# Patient Record
Sex: Female | Born: 1990 | Race: White | Hispanic: No | Marital: Single | Smoking: Former smoker
Health system: Southern US, Community
[De-identification: ages and names within clinical notes are randomized; demographics above are authoritative.]

## PROBLEM LIST (undated history)

## (undated) DIAGNOSIS — J45909 Unspecified asthma, uncomplicated: Secondary | ICD-10-CM

## (undated) DIAGNOSIS — G629 Polyneuropathy, unspecified: Secondary | ICD-10-CM

## (undated) DIAGNOSIS — F419 Anxiety disorder, unspecified: Secondary | ICD-10-CM

## (undated) DIAGNOSIS — F5081 Binge eating disorder: Secondary | ICD-10-CM

## (undated) DIAGNOSIS — E119 Type 2 diabetes mellitus without complications: Secondary | ICD-10-CM

## (undated) DIAGNOSIS — I10 Essential (primary) hypertension: Secondary | ICD-10-CM

## (undated) DIAGNOSIS — F32A Depression, unspecified: Secondary | ICD-10-CM

## (undated) HISTORY — DX: Type 2 diabetes mellitus without complications: E11.9

## (undated) HISTORY — DX: Unspecified asthma, uncomplicated: J45.909

## (undated) HISTORY — PX: CHOLECYSTECTOMY: SHX55

## (undated) HISTORY — PX: APPENDECTOMY: SHX54

## (undated) HISTORY — DX: Binge eating disorder: F50.81

## (undated) HISTORY — DX: Anxiety disorder, unspecified: F41.9

## (undated) HISTORY — DX: Essential (primary) hypertension: I10

## (undated) HISTORY — DX: Depression, unspecified: F32.A

## (undated) HISTORY — PX: NASAL SINUS SURGERY: SHX719

## (undated) HISTORY — DX: Polyneuropathy, unspecified: G62.9

## (undated) HISTORY — PX: TUBAL LIGATION: SHX77

---

## 2013-09-27 DIAGNOSIS — M5440 Lumbago with sciatica, unspecified side: Secondary | ICD-10-CM | POA: Insufficient documentation

## 2013-09-27 DIAGNOSIS — M533 Sacrococcygeal disorders, not elsewhere classified: Secondary | ICD-10-CM | POA: Insufficient documentation

## 2013-09-27 DIAGNOSIS — M519 Unspecified thoracic, thoracolumbar and lumbosacral intervertebral disc disorder: Secondary | ICD-10-CM

## 2013-09-27 HISTORY — DX: Sacrococcygeal disorders, not elsewhere classified: M53.3

## 2013-09-27 HISTORY — DX: Lumbago with sciatica, unspecified side: M54.40

## 2013-09-27 HISTORY — DX: Unspecified thoracic, thoracolumbar and lumbosacral intervertebral disc disorder: M51.9

## 2017-08-31 HISTORY — DX: Morbid (severe) obesity due to excess calories: E66.01

## 2019-11-13 DIAGNOSIS — G8929 Other chronic pain: Secondary | ICD-10-CM

## 2019-11-13 DIAGNOSIS — M545 Low back pain, unspecified: Secondary | ICD-10-CM | POA: Insufficient documentation

## 2019-11-13 HISTORY — DX: Other chronic pain: G89.29

## 2020-02-04 ENCOUNTER — Other Ambulatory Visit: Payer: Self-pay | Admitting: Physician Assistant

## 2020-02-04 DIAGNOSIS — G8929 Other chronic pain: Secondary | ICD-10-CM | POA: Insufficient documentation

## 2020-02-04 DIAGNOSIS — M5442 Lumbago with sciatica, left side: Secondary | ICD-10-CM

## 2020-02-04 HISTORY — DX: Other chronic pain: G89.29

## 2020-02-13 ENCOUNTER — Ambulatory Visit
Admission: RE | Admit: 2020-02-13 | Discharge: 2020-02-13 | Disposition: A | Discharge status: Home / Self Care | Payer: Medicaid Other | Source: Ambulatory Visit | Attending: Physician Assistant | Admitting: Physician Assistant

## 2020-02-13 ENCOUNTER — Other Ambulatory Visit: Payer: Self-pay

## 2020-02-13 DIAGNOSIS — M5442 Lumbago with sciatica, left side: Secondary | ICD-10-CM

## 2020-02-13 DIAGNOSIS — G8929 Other chronic pain: Secondary | ICD-10-CM

## 2020-02-13 MED ORDER — IOPAMIDOL (ISOVUE-M 200) INJECTION 41%
1.0000 mL | Freq: Once | INTRAMUSCULAR | Status: AC
  Administered 2020-02-13: 1 mL via INTRA_ARTICULAR

## 2020-02-13 MED ORDER — METHYLPREDNISOLONE ACETATE 40 MG/ML INJ SUSP (RADIOLOG
120.0000 mg | Freq: Once | INTRAMUSCULAR | Status: AC
  Administered 2020-02-13: 120 mg via INTRA_ARTICULAR

## 2020-02-13 NOTE — Discharge Instructions (Signed)

## 2020-09-21 ENCOUNTER — Other Ambulatory Visit: Payer: Self-pay | Admitting: Neurosurgery

## 2020-09-21 DIAGNOSIS — G8929 Other chronic pain: Secondary | ICD-10-CM

## 2022-02-18 ENCOUNTER — Encounter: Payer: Self-pay | Admitting: *Deleted

## 2022-02-18 ENCOUNTER — Encounter: Payer: Self-pay | Admitting: Cardiology

## 2022-02-18 DIAGNOSIS — J189 Pneumonia, unspecified organism: Secondary | ICD-10-CM

## 2022-02-18 HISTORY — DX: Pneumonia, unspecified organism: J18.9

## 2022-02-21 ENCOUNTER — Encounter: Payer: Self-pay | Admitting: *Deleted

## 2022-02-21 DIAGNOSIS — J45909 Unspecified asthma, uncomplicated: Secondary | ICD-10-CM | POA: Insufficient documentation

## 2022-02-21 DIAGNOSIS — F50819 Binge eating disorder, unspecified: Secondary | ICD-10-CM | POA: Insufficient documentation

## 2022-02-21 DIAGNOSIS — F5081 Binge eating disorder: Secondary | ICD-10-CM | POA: Insufficient documentation

## 2022-02-21 DIAGNOSIS — E119 Type 2 diabetes mellitus without complications: Secondary | ICD-10-CM | POA: Insufficient documentation

## 2022-02-21 DIAGNOSIS — F419 Anxiety disorder, unspecified: Secondary | ICD-10-CM | POA: Insufficient documentation

## 2022-02-21 DIAGNOSIS — F32A Depression, unspecified: Secondary | ICD-10-CM | POA: Insufficient documentation

## 2022-02-21 DIAGNOSIS — I10 Essential (primary) hypertension: Secondary | ICD-10-CM | POA: Insufficient documentation

## 2022-02-21 DIAGNOSIS — G629 Polyneuropathy, unspecified: Secondary | ICD-10-CM | POA: Insufficient documentation

## 2022-02-22 NOTE — Progress Notes (Unsigned)
Cardiology Office Note:    Date:  02/23/2022   ID:  Vanessa Chandler, DOB [birth year], MRN [id number]  PCP:  Laverle Hobby, NP  Cardiologist:  Shirlee More, MD   Referring MD: Laverle Hobby, NP  ASSESSMENT:    1. Precordial pain   2. Primary hypertension   3. Type 2 diabetes mellitus without complication, unspecified whether long term insulin use ([place])   4. Xanthelasma   5. Palpitations    PLAN:    In order of problems listed above:  Despite her young age she has multiple cardiovascular risk including type 2 diabetes hypertension cigarette smoking and a family history of early onset CAD.  On physical exam she has xanthelasma and risk with family all hyperlipidemia and today we will do a lipid profile including LP(a).  We discussed modalities for ischemia evaluation and I think that despite her young age she is served best by a cardiac CTA and will give [place] an absolute answer yes no if she has coronary disease coronary calcium score to judge whether she would benefit from lipid-lowering therapy. Diabetes hypertension are stable currently not on cardiovascular medications Check lipid profile at risk for familial hyperlipidemia He has also had palpitation not severe or sustained and is currently wearing an event monitor through her primary care physician.  Next appointment 3 months   Medication Adjustments/Labs and Tests Ordered: Current medicines are reviewed at length with the patient today.  Concerns regarding medicines are outlined above.  No orders of the defined types were placed in this encounter.  No orders of the defined types were placed in this encounter.    Chief Complaint  Patient presents with   Chest Pain    History of Present Illness:    Vanessa Chandler is a 32 y.o. female with a history of type 2 diabetes and hypertension who is being seen today for the evaluation of chest pain at the request of Laverle Hobby, NP.  No other complaint was palpitation appears  and that that event monitor was applied by her primary care physician when she was seen 02/10/2022.  She was seen [facility] for chest pain 02/03/2021 she was felt not to have acute coronary syndrome the EKG was described as sinus rhythm with T wave inversions to 3 and aVF similar to the previous in 2019.  High-sensitivity troponin assay CMP with potassium 3.8 otherwise normal chest x-ray normal  He has a history of type 2 diabetes now controlled without medications after weight loss of greater than 80 pounds supervised medical with phentermine Has no diabetic complications kidney neuropathy or eye No history of heart disease congenital rheumatic heart murmur or atrial fibrillation  Her clinical problem is chest pain there are 2 forms 1 and she notices very locations in the chest pins-and-needles momentarily Secondary she is having symptoms quite suggestive of angina with precordial pressure starts substernally radiates through both chest and up into the neck the episodes occur without activity but are relieved with about 30 minutes of rest.  She has had 2 ED visits for this with a troponin is negative discharged home told she has an abnormal EKG and a follow-up with cardiology  She has exercise intolerance and exertional shortness of breath more than moderate activity this has been a chronic chronic Risk factors also include father with onset of heart disease in his early 34s and cigarette smoking.   Past Medical History:  Diagnosis Date   Anxiety    Asthma  Binge eating disorder    Depression    Hypertension    Midline low back pain with sciatica 09/27/2013   Morbid obesity with BMI of 40.0-44.9, adult ([place]) 08/31/2017   Formatting of this note might be different from the original. TSH  & HgbA1c drawn at [facility].   Neuropathy    Sacroiliac joint dysfunction of both sides 09/27/2013   Type 2 diabetes mellitus ([place])     Past Surgical History:  Procedure  Laterality Date   APPENDECTOMY     CESAREAN SECTION     CHOLECYSTECTOMY     NASAL SINUS SURGERY     TUBAL LIGATION      Current Medications: Current Meds  Medication Sig   linaclotide (LINZESS) 290 MCG CAPS capsule Take 290 mcg by mouth daily before breakfast.   phentermine 37.5 MG capsule Take 37.5 mg by mouth every morning.     Allergies:   Patient has no known allergies.   Social History   Socioeconomic History   Marital status: Single    Spouse name: Not on file   Number of children: Not on file   Years of education: Not on file   Highest education level: Not on file  Occupational History   Not on file  Tobacco Use   Smoking status: Former    Packs/day: 0.50    Types: Cigarettes   Smokeless tobacco: Never  Substance and Sexual Activity   Alcohol use: Not on file   Drug use: Not on file   Sexual activity: Not on file  Other Topics Concern   Not on file  Social History Narrative   Not on file   Social Determinants of Health   Financial Resource Strain: Not on file  Food Insecurity: Not on file  Transportation Needs: Not on file  Physical Activity: Not on file  Stress: Not on file  Social Connections: Not on file     Family History: The patient's family history includes Breast cancer in her maternal aunt; Heart disease in her father; Hypertension in her father; Lung cancer in her maternal grandfather; Pancreatic cancer in her paternal grandfather.  ROS:   ROS Please see the history of present illness.     All other systems reviewed and are negative.  EKGs/Labs/Other Studies Reviewed:    The following studies were reviewed today:   EKG:  EKG is  ordered today.  The ekg ordered today is personally reviewed and demonstrates sinus rhythm minor ST-T abnormality nonspecific   Physical Exam:    VS:  BP 110/74 (BP Location: Right Arm, Patient Position: Sitting)   Pulse 75   Ht 4' 10"$  (1.473 m)   Wt 179 lb 3.2 oz (81.3 kg)   SpO2 99%   BMI 37.45 kg/m      Wt Readings from Last 3 Encounters:  02/23/22 179 lb 3.2 oz (81.3 kg)  02/10/22 174 lb (78.9 kg)     GEN: She has xanthelasma about the left eye well nourished, well developed in no acute distress HEENT: Normal NECK: No JVD; No carotid bruits LYMPHATICS: No lymphadenopathy CARDIAC: RRR, no murmurs, rubs, gallops RESPIRATORY:  Clear to auscultation without rales, wheezing or rhonchi  ABDOMEN: Soft, non-tender, non-distended MUSCULOSKELETAL:  No edema; No deformity  SKIN: Warm and dry NEUROLOGIC:  Alert and oriented x 3 PSYCHIATRIC:  Normal affect   Seen with Jacobo Forest, RN chaperone    Signed, Shirlee More, MD  02/23/2022 11:24 AM    [place]

## 2022-02-23 ENCOUNTER — Encounter: Payer: Self-pay | Admitting: Cardiology

## 2022-02-23 ENCOUNTER — Ambulatory Visit: Payer: Managed Care, Other (non HMO) | Attending: Cardiology | Admitting: Cardiology

## 2022-02-23 VITALS — BP 110/74 | HR 75 | Ht <= 58 in | Wt 179.2 lb

## 2022-02-23 DIAGNOSIS — R002 Palpitations: Secondary | ICD-10-CM

## 2022-02-23 DIAGNOSIS — H0260 Xanthelasma of unspecified eye, unspecified eyelid: Secondary | ICD-10-CM

## 2022-02-23 DIAGNOSIS — R072 Precordial pain: Secondary | ICD-10-CM

## 2022-02-23 DIAGNOSIS — I10 Essential (primary) hypertension: Secondary | ICD-10-CM | POA: Diagnosis not present

## 2022-02-23 DIAGNOSIS — E119 Type 2 diabetes mellitus without complications: Secondary | ICD-10-CM | POA: Diagnosis not present

## 2022-02-23 MED ORDER — METOPROLOL TARTRATE 100 MG PO TABS: 100.0000 mg | ORAL_TABLET | Freq: Once | ORAL | 0 refills | Status: DC

## 2022-02-23 NOTE — Patient Instructions (Addendum)
Medication Instructions:  Your physician recommends that you continue on your current medications as directed. Please refer to the Current Medication list given to you today.  *If you need a refill on your cardiac medications before your next appointment, please call your pharmacy*   Lab Work: Your physician recommends that you return for lab work in:   Labs 1 week before CT: BMP, Lipid,LPa  If you have labs (blood work) drawn today and your tests are completely normal, you will receive your results only by: [place] (if you have MyChart) OR A paper copy in the mail If you have any lab test that is abnormal or we need to change your treatment, we will call you to review the results.   Testing/Procedures:   Your cardiac CT will be scheduled at one of the below locations:   [facility] [address] [place], [place] [address] (336) [place] [address] [place], [place] [address] [phone]  [facility] [place],  [address] [phone]  If scheduled at [facility], please arrive at the [facility] and Children's Entrance (Entrance C2) of [facility] 30 minutes prior to test start time. You can use the FREE valet parking offered at entrance C (encouraged to control the heart rate for the test)  Proceed to the [facility] Radiology Department (first floor) to check-in and test prep.  All radiology patients and guests should use entrance C2 at [facility], accessed from [facility], even though the hospital's physical address listed is [address].    If scheduled at [facility] or [facility], please arrive 15 mins early for check-in and test prep.   Please follow these instructions carefully (unless otherwise directed):  On the Night Before  the Test: Be sure to Drink plenty of water. Do not consume any caffeinated/decaffeinated beverages or chocolate 12 hours prior to your test. Do not take any antihistamines 12 hours prior to your test.  On the Day of the Test: Drink plenty of water until 1 hour prior to the test. Do not eat any food 1 hour prior to test. You may take your regular medications prior to the test.  Take metoprolol (Lopressor) two hours prior to test. FEMALES- please wear underwire-free bra if available, avoid dresses & tight clothing      After the Test: Drink plenty of water. After receiving IV contrast, you may experience a mild flushed feeling. This is normal. On occasion, you may experience a mild rash up to 24 hours after the test. This is not dangerous. If this occurs, you can take Benadryl 25 mg and increase your fluid intake. If you experience trouble breathing, this can be serious. If it is severe call 911 IMMEDIATELY. If it is mild, please call our office.  We will call to schedule your test 2-4 weeks out understanding that some insurance companies will need an authorization prior to the service being performed.   For non-scheduling related questions, please contact the cardiac imaging nurse navigator should you have any questions/concerns: Marchia Bond, Cardiac Imaging Nurse Navigator Gordy Clement, Cardiac Imaging Nurse Navigator [facility] Direct Office Dial: [phone]   For scheduling needs, including cancellations and rescheduling, please call [place], [phone].    Follow-Up: At [facility], you and your health needs are our priority.  As  part of our continuing mission to provide you with exceptional heart care, we have created designated Provider Care Teams.  These Care Teams include your primary Cardiologist (physician) and Advanced Practice Providers (APPs -  Physician Assistants and Nurse Practitioners) who all work together to provide you  with the care you need, when you need it.  We recommend signing up for the patient portal called "MyChart".  Sign up information is provided on this After Visit Summary.  MyChart is used to connect with patients for Virtual Visits (Telemedicine).  Patients are able to view lab/test results, encounter notes, upcoming appointments, etc.  Non-urgent messages can be sent to your provider as well.   To learn more about what you can do with MyChart, go to [email or web address].    Your next appointment:   8 week(s)  Provider:   Shirlee More, MD    Other Instructions None  Accompanied physician for entire patient appointment.  Suann Larry. Kenton Kingfisher, RN

## 2022-02-28 ENCOUNTER — Telehealth: Payer: Self-pay | Admitting: Cardiology

## 2022-02-28 ENCOUNTER — Other Ambulatory Visit: Payer: Self-pay

## 2022-02-28 ENCOUNTER — Telehealth: Payer: Self-pay

## 2022-02-28 MED ORDER — METOPROLOL TARTRATE 100 MG PO TABS: 100.0000 mg | ORAL_TABLET | Freq: Once | ORAL | 0 refills | Status: DC

## 2022-02-28 NOTE — Telephone Encounter (Signed)
Spoke to the patient while she was having her blood drawn for her CT scan and informed her that I re-sent the metoprolol prescription to her pharmacy. Patient was appreciative for [place] sending the prescription again and had no further questions at this time.

## 2022-02-28 NOTE — Telephone Encounter (Signed)
Patient came in office for labs before CT- stated the pharmacy never received the rx for metoprolol needed for her CT scan.  Patient would like rx sent to [facility] in [place] on [address] CT is scheduled for this Wednesday- 03/02/22

## 2022-03-01 ENCOUNTER — Telehealth: Payer: Self-pay | Admitting: Cardiology

## 2022-03-01 ENCOUNTER — Other Ambulatory Visit: Payer: Self-pay

## 2022-03-01 ENCOUNTER — Telehealth (HOSPITAL_COMMUNITY): Payer: Self-pay | Admitting: *Deleted

## 2022-03-01 ENCOUNTER — Other Ambulatory Visit: Payer: Self-pay | Admitting: Cardiology

## 2022-03-01 LAB — BASIC METABOLIC PANEL
BUN/Creatinine Ratio: 17 (ref 9–23)
BUN: 12 mg/dL (ref 6–20)
CO2: 26 mmol/L (ref 20–29)
Calcium: 9.4 mg/dL (ref 8.7–10.2)
Chloride: 103 mmol/L (ref 96–106)
Creatinine, Ser: 0.69 mg/dL (ref 0.57–1.00)
Glucose: 87 mg/dL (ref 70–99)
Potassium: 4.3 mmol/L (ref 3.5–5.2)
Sodium: 135 mmol/L (ref 134–144)
eGFR: 117 mL/min/{1.73_m2} (ref >59)

## 2022-03-01 LAB — LIPID PANEL
Chol/HDL Ratio: 2.9 ratio (ref 0.0–4.4)
Cholesterol, Total: 152 mg/dL (ref 100–199)
HDL: 53 mg/dL (ref >39)
LDL Chol Calc (NIH): 84 mg/dL (ref 0–99)
Triglycerides: 81 mg/dL (ref 0–149)
VLDL Cholesterol Cal: 15 mg/dL (ref 5–40)

## 2022-03-01 LAB — LIPOPROTEIN A (LPA): Lipoprotein (a): 220.9 nmol/L — ABNORMAL HIGH (ref <75.0)

## 2022-03-01 NOTE — Telephone Encounter (Signed)
Attempted to call patient regarding upcoming cardiac CT appointment. °Left message on voicemail with name and callback number ° °Marygrace Sandoval RN Navigator Cardiac Imaging °[facility] °[phone] Office °[phone] Cell ° °

## 2022-03-01 NOTE — Telephone Encounter (Signed)
Called patient and informed her that she needed to take the medication that was prescribed for her cardiac CTA. It will help to relax her heart so they can get accurate results. Patient also concerned about the lipoprotien level being so high and I explained that was the reason she needed to have the cardiac CTA. Patient was agreeable with this plan to have the cardiac CTA and had no further questions at this time.

## 2022-03-01 NOTE — Telephone Encounter (Signed)
Patient has concerns with lab results. Lipoprotein is out of range. She worries that she should not take medication needed prior to her CT tomorrow. Please advise as soon as possible.

## 2022-03-02 ENCOUNTER — Ambulatory Visit (HOSPITAL_COMMUNITY)
Admission: RE | Admit: 2022-03-02 | Discharge: 2022-03-02 | Disposition: A | Discharge status: Home / Self Care | Payer: Managed Care, Other (non HMO) | Source: Ambulatory Visit | Attending: Cardiology | Admitting: Cardiology

## 2022-03-02 DIAGNOSIS — R072 Precordial pain: Secondary | ICD-10-CM

## 2022-03-02 MED ORDER — NITROGLYCERIN 0.4 MG SL SUBL
SUBLINGUAL_TABLET | SUBLINGUAL | Status: AC
  Administered 2022-03-02: 0.8 mg via SUBLINGUAL
  Filled 2022-03-02: qty 2

## 2022-03-02 MED ORDER — IOHEXOL 350 MG/ML SOLN
100.0000 mL | Freq: Once | INTRAVENOUS | Status: AC | PRN
  Administered 2022-03-02: 100 mL via INTRAVENOUS

## 2022-03-02 MED ORDER — NITROGLYCERIN 0.4 MG SL SUBL: 0.8000 mg | SUBLINGUAL_TABLET | Freq: Once | SUBLINGUAL | Status: AC

## 2022-03-03 ENCOUNTER — Other Ambulatory Visit: Payer: Self-pay

## 2022-03-03 DIAGNOSIS — R072 Precordial pain: Secondary | ICD-10-CM

## 2022-03-03 MED ORDER — ROSUVASTATIN CALCIUM 5 MG PO TABS: 5.0000 mg | ORAL_TABLET | Freq: Every day | ORAL | 3 refills | Status: DC

## 2022-03-24 ENCOUNTER — Telehealth: Payer: Self-pay

## 2022-03-24 NOTE — Telephone Encounter (Signed)
Zio monitor results received from patients referring provider. Scanned to chart media forward to Dr Bettina Gavia for review

## 2022-04-11 NOTE — Progress Notes (Unsigned)
Cardiology Office Note:    Date:  04/12/2022   ID:  Vanessa Chandler, DOB [birth year], MRN [id number]  PCP:  Laverle Hobby, NP  Cardiologist:  Shirlee More, MD    Referring MD: Laverle Hobby, NP    ASSESSMENT:    1. Mild CAD   2. Elevated lipoprotein(a)   3. Primary hypertension   4. Type 2 diabetes mellitus without complication, unspecified whether long term insulin use    PLAN:    In order of problems listed above:  Cardiac CTA with minimal soft noncalcified plaque less than 25% 1 area LAD but not normal elevated LP(a) would benefit from lipid-lowering therapy as a treatment small molecule is available would be appropriate to initiate and bring her back in 1 year for follow-up Stable hypertension diabetes Have a lipid profile her next follow-up with her PCP Screen her father brother and children for LP(a)   Next appointment: 1 year   Medication Adjustments/Labs and Tests Ordered: Current medicines are reviewed at length with the patient today.  Concerns regarding medicines are outlined above.  No orders of the defined types were placed in this encounter.  No orders of the defined types were placed in this encounter.   No chief complaint on file.   History of Present Illness:    Vanessa Chandler is a 33 y.o. female with a hx of hypertension type 2 diabetes former cigarette smoker family history of premature CAD 02/23/2022 last seen.  Was recently seen in [place] ED 03/23/2022 with a syndrome of 1 day of chest back pain cough productive with sputum and subjective fever diffuse myalgias.  He was called and viral syndrome.  EKGs were unremarkable troponin was normal.  LP(a) was severely elevated at 221 nmol/l  She underwent cardiac CTA reported 03/02/2022 with a coronary calcium score of 0 and minimal nonobstructive CAD with noncalcified plaque 1 to 24% left anterior descending coronary artery and second marginal branch.  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 [employer] has good healthcare knowledge I gave her information from [place] [facility] educated about LP(a) and told her I thought that her children brother and father should be screened Despite a coronary calcium score of 0 she has minimal soft plaque in the coronaries has a genetic lipid disorder I think she would benefit at this point in time with statin therapy and I will ask her at her next visit with her PCP to have a lipid profile performed An event monitor showed no significant arrhythmia Past Medical History:  Diagnosis Date   Anxiety    Asthma    Binge eating disorder    Depression    Hypertension    Midline low back pain with sciatica 09/27/2013   Morbid obesity with BMI of 40.0-44.9, adult 08/31/2017   Formatting of this note might be different from the original. TSH  & HgbA1c drawn at [facility].   Neuropathy    Sacroiliac joint dysfunction of both sides 09/27/2013   Type 2 diabetes mellitus     Past Surgical History:  Procedure Laterality Date   APPENDECTOMY     CESAREAN SECTION     CHOLECYSTECTOMY     NASAL SINUS SURGERY     TUBAL LIGATION      Current Medications: Current Meds  Medication Sig   linaclotide (LINZESS) 290 MCG CAPS capsule Take 290 mcg by mouth daily before breakfast.   phentermine 37.5 MG capsule Take 37.5 mg by mouth every morning.  rosuvastatin (CRESTOR) 5 MG tablet Take 1 tablet (5 mg total) by mouth daily.     Allergies:   Patient has no known allergies.   Social History   Socioeconomic History   Marital status: Single    Spouse name: Not on file   Number of children: Not on file   Years of education: Not on file   Highest education level: Not on file  Occupational History   Not on file  Tobacco Use   Smoking status: Former    Packs/day: .5    Types: Cigarettes   Smokeless tobacco: Never  Substance and Sexual Activity   Alcohol use: Not on file   Drug use: Not on  file   Sexual activity: Not on file  Other Topics Concern   Not on file  Social History Narrative   Not on file   Social Determinants of Health   Financial Resource Strain: Not on file  Food Insecurity: Not on file  Transportation Needs: Not on file  Physical Activity: Not on file  Stress: Not on file  Social Connections: Not on file     Family History: The patient's family history includes Breast cancer in her maternal aunt; Heart disease in her father; Hypertension in her father; Lung cancer in her maternal grandfather; Pancreatic cancer in her paternal grandfather. ROS:   Please see the history of present illness.    All other systems reviewed and are negative.  EKGs/Labs/Other Studies Reviewed:    The following studies were reviewed today:  Cardiac Studies & Procedures          CT SCANS  CT CORONARY MORPH W/CTA COR W/SCORE 03/04/2022  Addendum 03/04/2022 11:54 PM ADDENDUM REPORT: 03/04/2022 23:52  EXAM: OVER-READ INTERPRETATION  CT CHEST  The following report is an over-read performed by radiologist Dr. [facility] on 03/04/2022. This over-read does not include interpretation of cardiac or coronary anatomy or pathology. The coronary CTA interpretation by the cardiologist is attached.  COMPARISON:  01/16/2017  FINDINGS: Visualized lungs are clear. Central airways are patent. Bronchial wall thickening is present in keeping with airway inflammation. No pathologic thoracic adenopathy. Esophagus unremarkable. Visualized upper abdomen is unremarkable. No acute bone abnormality. No lytic or blastic bone lesion.  IMPRESSION: 1. Bronchial wall thickening in keeping with airway inflammation.   Electronically Signed By: Fidela Salisbury M.D. On: 03/04/2022 23:52  Narrative CLINICAL DATA:  32 Year-old White Female  EXAM: Cardiac/Coronary  CTA  TECHNIQUE: The patient was scanned on a [employer].  FINDINGS: Scan was  triggered in the descending thoracic aorta. Axial non-contrast 3 mm slices were carried out through the heart. The data set was analyzed on a dedicated work station and scored using the [place]. Gantry rotation speed was 250 msecs and collimation was .6 mm. 0.8 mg of sl NTG was given. The 3D data set was reconstructed in 5% intervals of the 67-82 % of the R-R cycle. Diastolic phases were analyzed on a dedicated work station using MPR, MIP and VRT modes. The patient received 100 cc of contrast.  Coronary Arteries:  Normal coronary origin.  Right dominance.  Coronary Calcium Score:  Left main: 0  Left anterior descending artery: 0  Left circumflex artery: 0  Right coronary artery: 0  Total: 0  Percentile: 1st for age, sex, and race matched control.  RCA is a large dominant artery that gives rise to PDA and PLA. There is no significant plaque.  Left main is a  large artery that gives rise to LAD and LCX arteries. There is no significant plaque.  LAD is a large vessel that gives rise to one large D1. Minimal soft plaque (1-24%) mid LAD.  LCX is a non-dominant artery that gives rise to multiple OM vessels. Minimal soft plaque (1-24%) mid OM2.  Other findings:  Aorta: Normal size.  No calcifications.  No dissection.  Main Pulmonary Artery: Normal size of the pulmonary artery.  Systemic Veins: Normal drainage  Aortic Valve:  Tri-leaflet.  No calcifications.  Mitral valve: No calcifications.  Normal pulmonary vein drainage into the left atrium.  Normal left atrial appendage without a thrombus.  Interatrial septum with no communications.  Left Ventricle: Normal size  Left Atrium: Normal size  Right Ventricle: Normal size  Right Atrium: Normal size  Pericardium: Normal thickness  Extra-cardiac findings: See attached radiology report for non-cardiac structures.  IMPRESSION: 1. Coronary calcium score of 0. This was 1st percentile for age, sex, and race  matched control.  2. Normal coronary origin with right dominance.  3. CAD-RADS 1. Minimal non-obstructive CAD (1-24%). Consider non-atherosclerotic causes of chest pain. Consider preventive therapy and risk factor modification. Images saved to PACS.  RECOMMENDATIONS: RECOMMENDATIONS The proposed cut-off value of 1,651 AU yielded a 93 % sensitivity and 75 % specificity in grading AS severity in patients with classical low-flow, low-gradient AS. Proposed different cut-off values to define severe AS for men and women as 2,065 AU and 1,274 AU, respectively. The joint European and American recommendations for the assessment of AS consider the aortic valve calcium score as a continuum - a very high calcium score suggests severe AS and a low calcium score suggests severe AS is unlikely.  Kerman Passey, et al. 2017 ESC/EACTS Guidelines for the management of valvular heart disease. Eur Heart J 2017;38:2739-91.  Coronary artery calcium (CAC) score is a strong predictor of incident coronary heart disease (CHD) and provides predictive information beyond traditional risk factors. CAC scoring is reasonable to use in the decision to withhold, postpone, or initiate statin therapy in intermediate-risk or selected borderline-risk asymptomatic adults (age 44-75 years and LDL-C >=70 to <190 mg/dL) who do not have diabetes or established atherosclerotic cardiovascular disease (ASCVD).* In intermediate-risk (10-year ASCVD risk >=7.5% to <20%) adults or selected borderline-risk (10-year ASCVD risk >=5% to <7.5%) adults in whom a CAC score is measured for the purpose of making a treatment decision the following recommendations have been made:  If CAC = 0, it is reasonable to withhold statin therapy and reassess in 5 to 10 years, as long as higher risk conditions are absent (diabetes mellitus, family history of premature CHD in first degree relatives (males <55 years; females <65  years), cigarette smoking, LDL >=190 mg/dL or other independent risk factors).  If CAC is 1 to 99, it is reasonable to initiate statin therapy for patients >=8 years of age.  If CAC is >=100 or >=75th percentile, it is reasonable to initiate statin therapy at any age.  Cardiology referral should be considered for patients with CAC scores =400 or >=75th percentile.  *2018 AHA/ACC/AACVPR/AAPA/ABC/ACPM/ADA/AGS/APhA/ASPC/NLA/PCNA Guideline on the Management of Blood Cholesterol: A Report of the American College of Cardiology/American Heart Association Task Force on Clinical Practice Guidelines. J Am Coll Cardiol. 2019;73(24):3168-3209.  Rudean Haskell, MD  Electronically Signed: By: Rudean Haskell M.D. On: 03/02/2022 15:45            Recent Labs: 02/28/2022: BUN 12; Creatinine, Ser 0.69; Potassium 4.3; Sodium 135  Recent Lipid Panel    Component Value Date/Time   CHOL 152 02/28/2022 0805   TRIG 81 02/28/2022 0805   HDL 53 02/28/2022 0805   CHOLHDL 2.9 02/28/2022 0805   LDLCALC 84 02/28/2022 0805    Physical Exam:    VS:  BP 120/78 (BP Location: Right Arm, Patient Position: Sitting)   Pulse 70   Ht 4\' 10"  (1.473 m)   Wt 181 lb (82.1 kg)   SpO2 99%   BMI 37.83 kg/m     Wt Readings from Last 3 Encounters:  04/12/22 181 lb (82.1 kg)  02/23/22 179 lb 3.2 oz (81.3 kg)  02/10/22 174 lb (78.9 kg)     GEN:  Well nourished, well developed in no acute distress HEENT: Normal NECK: No JVD; No carotid bruits LYMPHATICS: No lymphadenopathy CARDIAC: RRR, no murmurs, rubs, gallops RESPIRATORY:  Clear to auscultation without rales, wheezing or rhonchi  ABDOMEN: Soft, non-tender, non-distended MUSCULOSKELETAL:  No edema; No deformity  SKIN: Warm and dry NEUROLOGIC:  Alert and oriented x 3 PSYCHIATRIC:  Normal affect    Signed, Shirlee More, MD  04/12/2022 10:22 AM    [facility]

## 2022-04-12 ENCOUNTER — Ambulatory Visit: Payer: Managed Care, Other (non HMO) | Attending: Cardiology | Admitting: Cardiology

## 2022-04-12 ENCOUNTER — Encounter: Payer: Self-pay | Admitting: Cardiology

## 2022-04-12 VITALS — BP 120/78 | HR 70 | Ht <= 58 in | Wt 181.0 lb

## 2022-04-12 DIAGNOSIS — E119 Type 2 diabetes mellitus without complications: Secondary | ICD-10-CM | POA: Diagnosis not present

## 2022-04-12 DIAGNOSIS — I10 Essential (primary) hypertension: Secondary | ICD-10-CM

## 2022-04-12 DIAGNOSIS — I2510 Atherosclerotic heart disease of native coronary artery without angina pectoris: Secondary | ICD-10-CM

## 2022-04-12 DIAGNOSIS — E7841 Elevated Lipoprotein(a): Secondary | ICD-10-CM | POA: Diagnosis not present

## 2022-04-12 NOTE — Patient Instructions (Signed)
Medication Instructions:  Your physician recommends that you continue on your current medications as directed. Please refer to the Current Medication list given to you today.  *If you need a refill on your cardiac medications before your next appointment, please call your pharmacy*   Lab Work: None If you have labs (blood work) drawn today and your tests are completely normal, you will receive your results only by: [place] (if you have MyChart) OR A paper copy in the mail If you have any lab test that is abnormal or we need to change your treatment, we will call you to review the results.   Testing/Procedures: None   Follow-Up: At [facility], you and your health needs are our priority.  As part of our continuing mission to provide you with exceptional heart care, we have created designated Provider Care Teams.  These Care Teams include your primary Cardiologist (physician) and Advanced Practice Providers (APPs -  Physician Assistants and Nurse Practitioners) who all work together to provide you with the care you need, when you need it.  We recommend signing up for the patient portal called "MyChart".  Sign up information is provided on this After Visit Summary.  MyChart is used to connect with patients for Virtual Visits (Telemedicine).  Patients are able to view lab/test results, encounter notes, upcoming appointments, etc.  Non-urgent messages can be sent to your provider as well.   To learn more about what you can do with MyChart, go to [email or web address].    Your next appointment:   1 year(s)  Provider:   Shirlee More, MD    Other Instructions Purchase a flutter valve  Check lipids at next appointment with PCP.

## 2022-05-03 IMAGING — XA DG FLUORO GUIDE SPINAL/SI JT INJ*R*
2 series · 2 of 2 positions shown · non-contrast
Comparison: none

CLINICAL DATA: Bilateral low back and leg pain, worse on the left.

[Series 1: ortho adipose · 1 of 1 slices shown (1 of 2)]
[im 1/1]
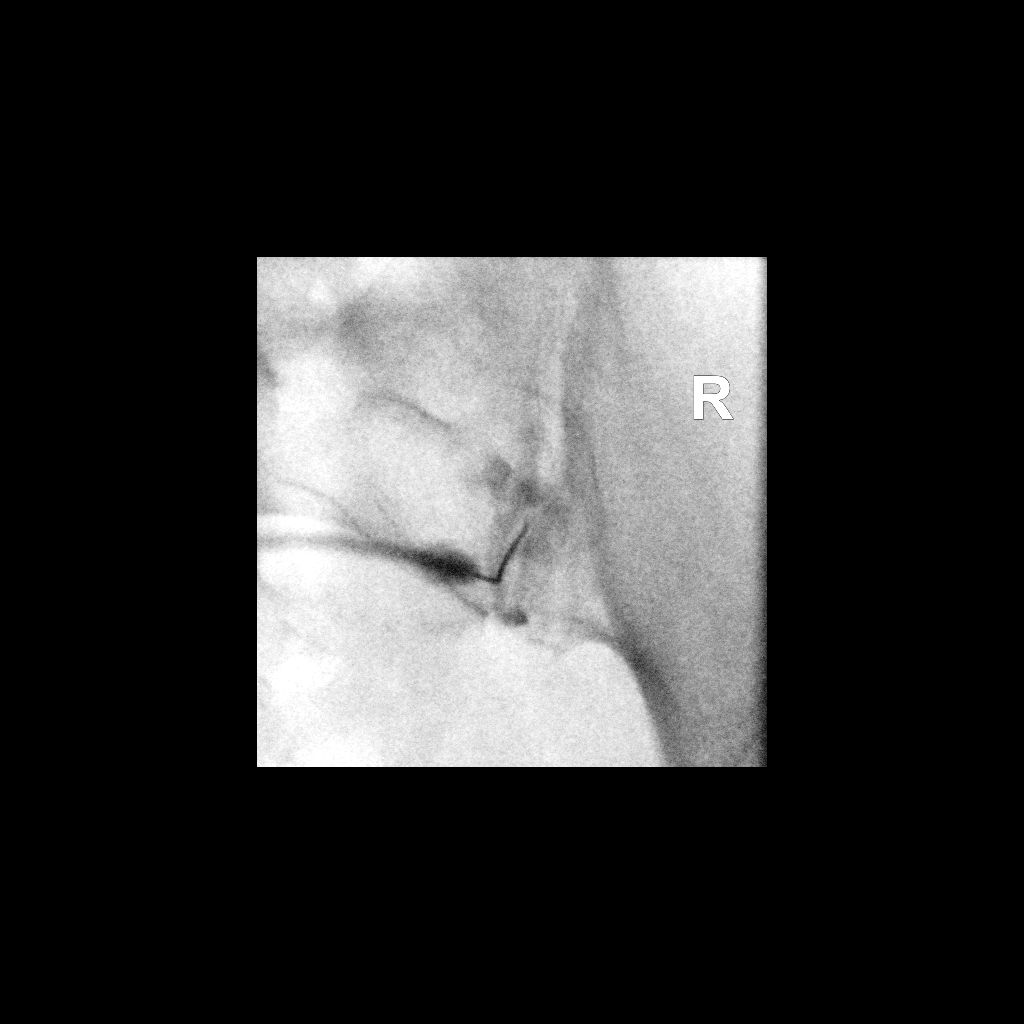

[Series 2: ortho adipose · 1 of 1 slices shown (2 of 2)]
[im 1/1]
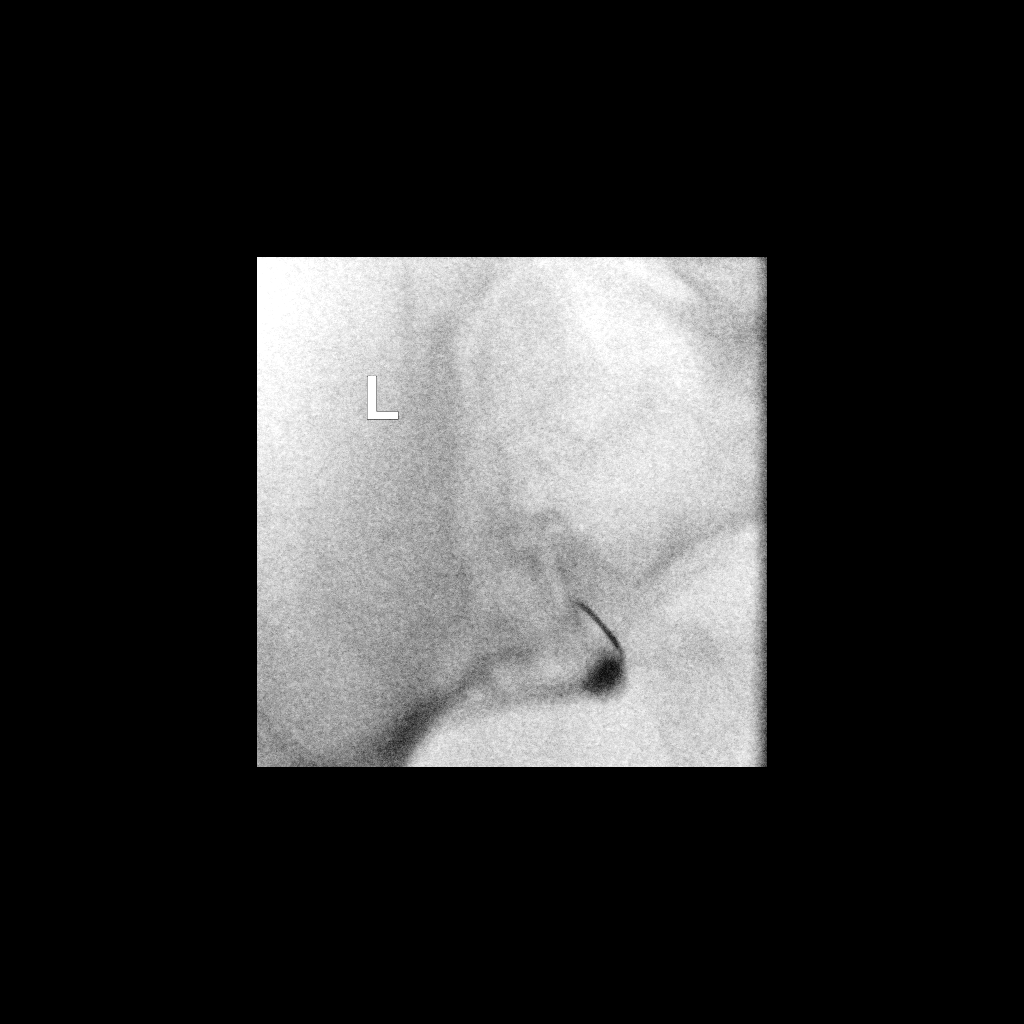

[2 of 2 positions shown; findings below may reference images not displayed]

FLUOROSCOPY TIME:  Fluoroscopy Time: 41 seconds

Radiation Exposure Index: 41.31 microGray*m^2

PROCEDURE:
BILATERAL SI JOINT INJECTION.

After a thorough discussion of risks and benefits of the procedure,
including bleeding, infection, injury to nerves, blood vessels, and
adjacent structures, verbal and written consent was obtained. The
patient was placed prone on the fluoroscopy table and localization
was performed over the sacrum. Target sites marked using
fluoroscopic guidance. The skin was prepped and draped in the usual
sterile fashion using Betadine soap.

After local anesthesia with 1% lidocaine without epinephrine and
subsequent deep anesthesia, 3.5 inch 22 gauge spinal needles were
advanced into the right and subsequently left SI joints under
fluoroscopic guidance. Injection of a small amount of Isovue-M 200
confirmed intra-articular placement. No vascular uptake present.
Subsequently, 60 mg of Depo-Medrol and 1 mL of 0.5% bupivacaine were
injected into each SI joint. The injections resulted in concordant
pain on both sides, particularly the left. The needles were removed
and a sterile dressing applied.

No complications were observed. The patient was observed and
released under the care of a driver after 20 minutes.
IMPRESSION: Successful fluoroscopically guided bilateral SI joint injections.

## 2022-05-03 IMAGING — XA DG FLUORO GUIDE SPINAL/SI JT INJ*L*
2 series · 2 of 2 positions shown · non-contrast
Comparison: none

CLINICAL DATA: Bilateral low back and leg pain, worse on the left.

[Series 1: ortho adipose · 1 of 1 slices shown (1 of 2)]
[im 1/1]
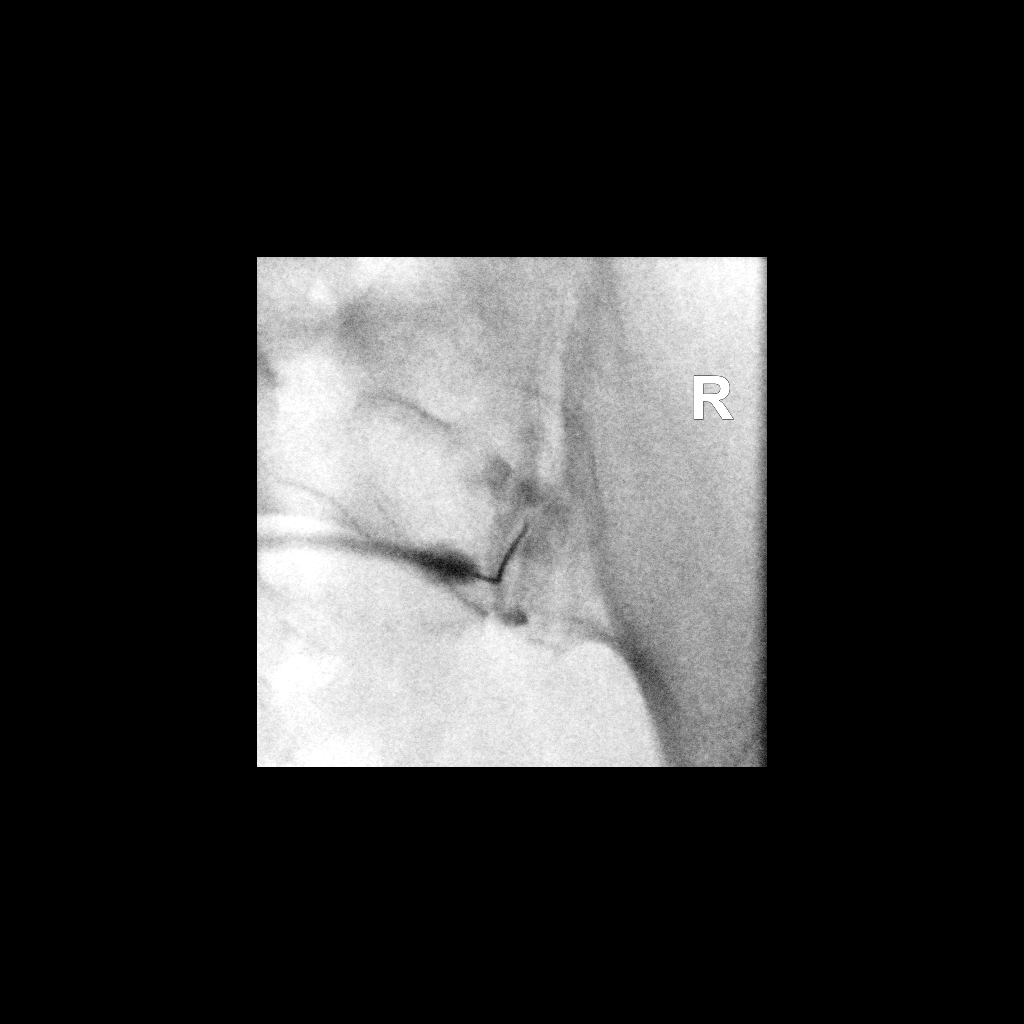

[Series 2: ortho adipose · 1 of 1 slices shown (2 of 2)]
[im 1/1]
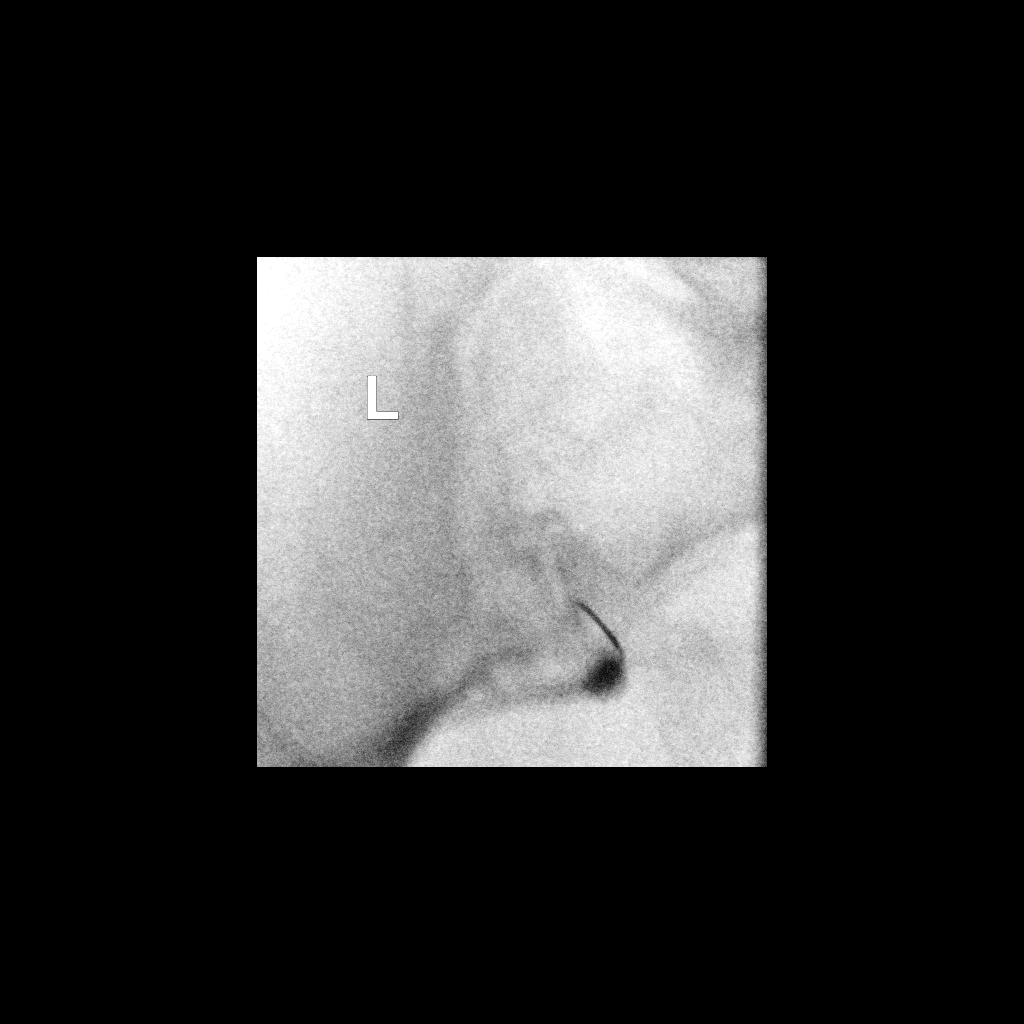

[2 of 2 positions shown; findings below may reference images not displayed]

FLUOROSCOPY TIME:  Fluoroscopy Time: 41 seconds

Radiation Exposure Index: 41.31 microGray*m^2

PROCEDURE:
BILATERAL SI JOINT INJECTION.

After a thorough discussion of risks and benefits of the procedure,
including bleeding, infection, injury to nerves, blood vessels, and
adjacent structures, verbal and written consent was obtained. The
patient was placed prone on the fluoroscopy table and localization
was performed over the sacrum. Target sites marked using
fluoroscopic guidance. The skin was prepped and draped in the usual
sterile fashion using Betadine soap.

After local anesthesia with 1% lidocaine without epinephrine and
subsequent deep anesthesia, 3.5 inch 22 gauge spinal needles were
advanced into the right and subsequently left SI joints under
fluoroscopic guidance. Injection of a small amount of Isovue-M 200
confirmed intra-articular placement. No vascular uptake present.
Subsequently, 60 mg of Depo-Medrol and 1 mL of 0.5% bupivacaine were
injected into each SI joint. The injections resulted in concordant
pain on both sides, particularly the left. The needles were removed
and a sterile dressing applied.

No complications were observed. The patient was observed and
released under the care of a driver after 20 minutes.
IMPRESSION: Successful fluoroscopically guided bilateral SI joint injections.

## 2022-07-06 ENCOUNTER — Telehealth: Payer: Self-pay | Admitting: Cardiology

## 2022-07-06 NOTE — Telephone Encounter (Signed)
Patient recently got a Apple Watch and has questions regarding the readings.

## 2022-07-06 NOTE — Telephone Encounter (Signed)
Left message for the patient to call back.

## 2022-07-07 NOTE — Telephone Encounter (Signed)
Called patient and she reported that her Apple watch was telling her that she was in A-fib for 4 percent of the time. Patient does not have a history of A-fib. Spoke with Dr. Dulce Sellar regarding this and he recommended that the patient come to the office and have an EKG performed. Informed the patient of Dr. Hulen Shouts recommendation and she was agreeable to this plan and a nurse visit was scheduled for her to have an EKG. Patient had no further questions at this time.

## 2022-07-12 ENCOUNTER — Ambulatory Visit: Payer: Managed Care, Other (non HMO) | Attending: Cardiology | Admitting: Cardiology

## 2022-07-12 VITALS — BP 120/82 | HR 68 | Wt 180.0 lb

## 2022-07-12 DIAGNOSIS — R0789 Other chest pain: Secondary | ICD-10-CM | POA: Diagnosis not present

## 2022-07-12 DIAGNOSIS — I2510 Atherosclerotic heart disease of native coronary artery without angina pectoris: Secondary | ICD-10-CM

## 2022-07-12 DIAGNOSIS — R002 Palpitations: Secondary | ICD-10-CM

## 2022-07-12 DIAGNOSIS — R072 Precordial pain: Secondary | ICD-10-CM | POA: Diagnosis not present

## 2022-07-12 DIAGNOSIS — E7841 Elevated Lipoprotein(a): Secondary | ICD-10-CM | POA: Diagnosis not present

## 2022-07-12 MED ORDER — ACEBUTOLOL HCL 200 MG PO CAPS: 200.0000 mg | ORAL_CAPSULE | Freq: Every day | ORAL | 3 refills | Status: AC

## 2022-07-12 MED ORDER — CELECOXIB 100 MG PO CAPS: 100.0000 mg | ORAL_CAPSULE | Freq: Two times a day (BID) | ORAL | 0 refills | Status: DC

## 2022-07-12 NOTE — Patient Instructions (Signed)
Medication Instructions:  Your physician has recommended you make the following change in your medication:   START: Celebrex 100 mg twice daily START: Sectral 200 mg daily  *If you need a refill on your cardiac medications before your next appointment, please call your pharmacy*   Lab Work: None If you have labs (blood work) drawn today and your tests are completely normal, you will receive your results only by: MyChart Message (if you have MyChart) OR A paper copy in the mail If you have any lab test that is abnormal or we need to change your treatment, we will call you to review the results.   Testing/Procedures: None   Follow-Up: At [facility], you and your health needs are our priority.  As part of our continuing mission to provide you with exceptional heart care, we have created designated Provider Care Teams.  These Care Teams include your primary Cardiologist (physician) and Advanced Practice Providers (APPs -  Physician Assistants and Nurse Practitioners) who all work together to provide you with the care you need, when you need it.  We recommend signing up for the patient portal called "MyChart".  Sign up information is provided on this After Visit Summary.  MyChart is used to connect with patients for Virtual Visits (Telemedicine).  Patients are able to view lab/test results, encounter notes, upcoming appointments, etc.  Non-urgent messages can be sent to your provider as well.   To learn more about what you can do with MyChart, go to [email or web address].    Your next appointment:   6 week(s)  Provider:   Norman Herrlich, MD    Other Instructions None
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
## 2022-07-13 ENCOUNTER — Telehealth: Payer: Self-pay

## 2022-07-13 ENCOUNTER — Other Ambulatory Visit: Payer: Self-pay

## 2022-07-13 DIAGNOSIS — E7841 Elevated Lipoprotein(a): Secondary | ICD-10-CM

## 2022-07-13 NOTE — Telephone Encounter (Signed)
Called patient and informed her of Dr. Hulen Shouts message below:  "Lipid profile in the next few weeks I would like her to come the office and have a CMP"  Patient verbalized understanding and had no further questions at this time.

## 2022-07-21 ENCOUNTER — Telehealth: Payer: Self-pay | Admitting: Cardiology

## 2022-07-21 NOTE — Telephone Encounter (Signed)
Pt is requesting a callback to see if she can get a letter stating she's cleared to be back at work with no restrictions since her job knew she was out from chest pains and had an appt on 7/2. Please advise

## 2022-07-25 NOTE — Telephone Encounter (Signed)
Left message for the patient to call back.

## 2022-07-27 NOTE — Telephone Encounter (Signed)
Called patient and informed her that her letter to return to work was completed and that I would leave it at the front desk for her to pick up. Patient verbalized understanding and had no further questions at this time.

## 2022-08-02 ENCOUNTER — Other Ambulatory Visit: Payer: Self-pay | Admitting: Cardiology

## 2022-08-22 NOTE — Progress Notes (Deleted)
  Cardiology Office Note:  .   Date:  08/22/2022  ID:  Katheran James, DOB April 17, 1990, MRN [id number] PCP: Joaquin Music, NP  [facility] Providers Cardiologist:  None { Click to update primary MD,subspecialty MD or APP then REFRESH:1}   History of Present Illness: .   Bhuvi Ballis is a 32 y.o. female with a past medical history of hypertension, DM 2, neuropathy, morbid obesity, depression, anxiety, elevated lipoprotein.  03/04/2022 coronary CTA revealed a coronary calcium score of 0.  07/08/2022 evaluated at [facility], troponin x 2 was negative, BNP negative, chest x-ray unrevealing.  She was given a GI cocktail and ultimately discharged home. ROS: ***  Studies Reviewed: .        *** Risk Assessment/Calculations:   {Does this patient have ATRIAL FIBRILLATION?:[phone]} No BP recorded.  {Refresh Note OR Click here to enter BP  :1}***       Physical Exam:   VS:  There were no vitals taken for this visit.   Wt Readings from Last 3 Encounters:  07/12/22 180 lb (81.6 kg)  04/12/22 181 lb (82.1 kg)  02/23/22 179 lb 3.2 oz (81.3 kg)    GEN: Well nourished, well developed in no acute distress NECK: No JVD; No carotid bruits CARDIAC: ***RRR, no murmurs, rubs, gallops RESPIRATORY:  Clear to auscultation without rales, wheezing or rhonchi  ABDOMEN: Soft, non-tender, non-distended EXTREMITIES:  No edema; No deformity   ASSESSMENT AND PLAN: .   Elevated LPA-220 on 02/28/2022, LDL 84    {Are you ordering a CV Procedure (e.g. stress test, cath, DCCV, TEE, etc)?   Press F2        :[id number]}  Dispo: ***  Signed, Flossie Dibble, NP

## 2022-08-23 ENCOUNTER — Ambulatory Visit: Payer: Managed Care, Other (non HMO) | Attending: Cardiology | Admitting: Cardiology

## 2022-09-26 NOTE — Progress Notes (Deleted)
Cardiology Office Note:  .   Date:  09/26/2022  ID:  Katheran James, DOB [birth year], MRN [id number] PCP: Joaquin Music, NP  [facility] Providers Cardiologist:  None { Click to update primary MD,subspecialty MD or APP then REFRESH:1}   History of Present Illness: .   Vanessa Chandler is a 32 y.o. female with a past medical history of hypertension, coronary artery disease-nonobstructive with soft plaque per cCTA, DM 2, asthma, neuropathy, anxiety, depression, dyslipidemia, elevated lipoprotein a.  03/02/2022 coronary CTA revealing a calcium score of 0 however soft plaque was noted in the LAD as well as left circumflex.  Most recently evaluated by Dr. Dulce Sellar on 07/12/2022 for precordial pain, she was given a short course of NSAIDs for 2 weeks.  Recently evaluated in the ED on 09/11/2022 for chest pain, she had had multiple ED visits for similar symptoms and it was felt to be precordial or costochondral chest pain.  Workup was unrevealing, however her lipase was elevated, felt could be related to idiopathic pancreatitis, referred to GI for further follow-up.  ROS: ROS   Studies Reviewed: .        Cardiac Studies & Procedures          CT SCANS  CT CORONARY MORPH W/CTA COR W/SCORE 03/02/2022  Addendum 03/04/2022 11:54 PM ADDENDUM REPORT: 03/04/2022 23:52  EXAM: OVER-READ INTERPRETATION  CT CHEST  The following report is an over-read performed by radiologist Dr. Audry Pili Radiology, PA on 03/04/2022. This over-read does not include interpretation of cardiac or coronary anatomy or pathology. The coronary CTA interpretation by the cardiologist is attached.  COMPARISON:  01/16/2017  FINDINGS: Visualized lungs are clear. Central airways are patent. Bronchial wall thickening is present in keeping with airway inflammation. No pathologic thoracic adenopathy. Esophagus unremarkable. Visualized upper abdomen is unremarkable. No acute bone abnormality. No lytic or  blastic bone lesion.  IMPRESSION: 1. Bronchial wall thickening in keeping with airway inflammation.   Electronically Signed By: Helyn Numbers M.D. On: 03/04/2022 23:52  Narrative CLINICAL DATA:  32 Year-old White Female  EXAM: Cardiac/Coronary  CTA  TECHNIQUE: The patient was scanned on a [employer].  FINDINGS: Scan was triggered in the descending thoracic aorta. Axial non-contrast 3 mm slices were carried out through the heart. The data set was analyzed on a dedicated work station and scored using the Agatson method. Gantry rotation speed was 250 msecs and collimation was .6 mm. 0.8 mg of sl NTG was given. The 3D data set was reconstructed in 5% intervals of the 67-82 % of the R-R cycle. Diastolic phases were analyzed on a dedicated work station using MPR, MIP and VRT modes. The patient received 100 cc of contrast.  Coronary Arteries:  Normal coronary origin.  Right dominance.  Coronary Calcium Score:  Left main: 0  Left anterior descending artery: 0  Left circumflex artery: 0  Right coronary artery: 0  Total: 0  Percentile: 1st for age, sex, and race matched control.  RCA is a large dominant artery that gives rise to PDA and PLA. There is no significant plaque.  Left main is a large artery that gives rise to LAD and LCX arteries. There is no significant plaque.  LAD is a large vessel that gives rise to one large D1. Minimal soft plaque (1-24%) mid LAD.  LCX is a non-dominant artery that gives rise to multiple OM vessels. Minimal soft plaque (1-24%) mid OM2.  Other findings:  Aorta: Normal size.  No calcifications.  No  dissection.  Main Pulmonary Artery: Normal size of the pulmonary artery.  Systemic Veins: Normal drainage  Aortic Valve:  Tri-leaflet.  No calcifications.  Mitral valve: No calcifications.  Normal pulmonary vein drainage into the left atrium.  Normal left atrial appendage without a thrombus.  Interatrial septum with  no communications.  Left Ventricle: Normal size  Left Atrium: Normal size  Right Ventricle: Normal size  Right Atrium: Normal size  Pericardium: Normal thickness  Extra-cardiac findings: See attached radiology report for non-cardiac structures.  IMPRESSION: 1. Coronary calcium score of 0. This was 1st percentile for age, sex, and race matched control.  2. Normal coronary origin with right dominance.  3. CAD-RADS 1. Minimal non-obstructive CAD (1-24%). Consider non-atherosclerotic causes of chest pain. Consider preventive therapy and risk factor modification. Images saved to PACS.  RECOMMENDATIONS: RECOMMENDATIONS The proposed cut-off value of 1,651 AU yielded a 93 % sensitivity and 75 % specificity in grading AS severity in patients with classical low-flow, low-gradient AS. Proposed different cut-off values to define severe AS for men and women as 2,065 AU and 1,274 AU, respectively. The joint European and American recommendations for the assessment of AS consider the aortic valve calcium score as a continuum - a very high calcium score suggests severe AS and a low calcium score suggests severe AS is unlikely.  Sunday Shams, et al. 2017 ESC/EACTS Guidelines for the management of valvular heart disease. Eur Heart J 2017;38:2739-91.  Coronary artery calcium (CAC) score is a strong predictor of incident coronary heart disease (CHD) and provides predictive information beyond traditional risk factors. CAC scoring is reasonable to use in the decision to withhold, postpone, or initiate statin therapy in intermediate-risk or selected borderline-risk asymptomatic adults (age 2-75 years and LDL-C >=70 to <190 mg/dL) who do not have diabetes or established atherosclerotic cardiovascular disease (ASCVD).* In intermediate-risk (10-year ASCVD risk >=7.5% to <20%) adults or selected borderline-risk (10-year ASCVD risk >=5% to <7.5%) adults in whom a CAC score is  measured for the purpose of making a treatment decision the following recommendations have been made:  If CAC = 0, it is reasonable to withhold statin therapy and reassess in 5 to 10 years, as long as higher risk conditions are absent (diabetes mellitus, family history of premature CHD in first degree relatives (males <55 years; females <65 years), cigarette smoking, LDL >=190 mg/dL or other independent risk factors).  If CAC is 1 to 99, it is reasonable to initiate statin therapy for patients >=77 years of age.  If CAC is >=100 or >=75th percentile, it is reasonable to initiate statin therapy at any age.  Cardiology referral should be considered for patients with CAC scores =400 or >=75th percentile.  *2018 AHA/ACC/AACVPR/AAPA/ABC/ACPM/ADA/AGS/APhA/ASPC/NLA/PCNA Guideline on the Management of Blood Cholesterol: A Report of the American College of Cardiology/American Heart Association Task Force on Clinical Practice Guidelines. J Am Coll Cardiol. 2019;73(24):3168-3209.  Riley Lam, MD  Electronically Signed: By: Riley Lam M.D. On: 03/02/2022 15:45          Risk Assessment/Calculations:     No BP recorded.  {Refresh Note OR Click here to enter BP  :1}***       Physical Exam:   VS:  There were no vitals taken for this visit.   Wt Readings from Last 3 Encounters:  07/12/22 180 lb (81.6 kg)  04/12/22 181 lb (82.1 kg)  02/23/22 179 lb 3.2 oz (81.3 kg)    GEN: Well nourished, well developed in no acute  distress NECK: No JVD; No carotid bruits CARDIAC: ***RRR, no murmurs, rubs, gallops RESPIRATORY:  Clear to auscultation without rales, wheezing or rhonchi  ABDOMEN: Soft, non-tender, non-distended EXTREMITIES:  No edema; No deformity   ASSESSMENT AND PLAN: .   Coronary artery disease - soft plaque noted on cCTA this year, Palpitations -  Elevated LPA - 220.9  {Are you ordering a CV Procedure (e.g. stress test, cath, DCCV, TEE, etc)?   Press F2         :[id number]}  Dispo: ***  Signed, Flossie Dibble, NP

## 2022-09-27 ENCOUNTER — Ambulatory Visit: Payer: Managed Care, Other (non HMO) | Attending: Cardiology | Admitting: Cardiology

## 2023-03-28 ENCOUNTER — Other Ambulatory Visit: Payer: Self-pay | Admitting: Cardiology

## 2023-07-04 ENCOUNTER — Other Ambulatory Visit: Payer: Self-pay | Admitting: Cardiology
